# Patient Record
Sex: Male | Born: 1965 | Race: White | Hispanic: No | Marital: Single
Health system: Southern US, Community
[De-identification: ages and names within clinical notes are randomized; demographics above are authoritative.]

---

## 2017-05-10 ENCOUNTER — Emergency Department (HOSPITAL_COMMUNITY): Payer: Self-pay

## 2017-05-10 ENCOUNTER — Emergency Department (HOSPITAL_COMMUNITY)
Admission: EM | Admit: 2017-05-10 | Discharge: 2017-05-10 | Discharge status: Against Medical Advice | Payer: Self-pay | Attending: Emergency Medicine | Admitting: Emergency Medicine

## 2017-05-10 ENCOUNTER — Other Ambulatory Visit: Payer: Self-pay

## 2017-05-10 DIAGNOSIS — X58XXXA Exposure to other specified factors, initial encounter: Secondary | ICD-10-CM | POA: Insufficient documentation

## 2017-05-10 DIAGNOSIS — Y999 Unspecified external cause status: Secondary | ICD-10-CM | POA: Insufficient documentation

## 2017-05-10 DIAGNOSIS — Y939 Activity, unspecified: Secondary | ICD-10-CM | POA: Insufficient documentation

## 2017-05-10 DIAGNOSIS — S5012XA Contusion of left forearm, initial encounter: Secondary | ICD-10-CM | POA: Insufficient documentation

## 2017-05-10 DIAGNOSIS — F10920 Alcohol use, unspecified with intoxication, uncomplicated: Secondary | ICD-10-CM | POA: Insufficient documentation

## 2017-05-10 DIAGNOSIS — Y929 Unspecified place or not applicable: Secondary | ICD-10-CM | POA: Insufficient documentation

## 2017-05-10 MED ORDER — TETANUS-DIPHTH-ACELL PERTUSSIS 5-2.5-18.5 LF-MCG/0.5 IM SUSP: 0.5000 mL | Freq: Once | INTRAMUSCULAR | Status: DC

## 2017-05-10 NOTE — ED Provider Notes (Signed)
TIME SEEN: 3:25 AM  CHIEF COMPLAINT: Left forearm injury  HPI: Patient is a 52 year old male who presents to the emergency department with left forearm injury.  He is unable to tell me how he injured his arm.  He is intoxicated today.  Denies any other injury.  Unsure of his last tetanus vaccination.  Reports that he is ambidextrous.  ROS: See HPI Constitutional: no fever  Eyes: no drainage  ENT: no runny nose   Cardiovascular:  no chest pain  Resp: no SOB  GI: no vomiting GU: no dysuria Integumentary: no rash  Allergy: no hives  Musculoskeletal: no leg swelling  Neurological: no slurred speech ROS otherwise negative  PAST MEDICAL HISTORY/PAST SURGICAL HISTORY:  No past medical history on file.  MEDICATIONS:  Prior to Admission medications   Not on File    ALLERGIES:  Allergies no known allergies  SOCIAL HISTORY:  Social History   Tobacco Use  . Smoking status: Not on file  Substance Use Topics  . Alcohol use: Not on file    FAMILY HISTORY: No family history on file.  EXAM: BP (!) 157/108 (BP Location: Right Arm)   Pulse (!) 107   Resp 16   Ht 5\' 11"  (1.803 m)   Wt 72.6 kg (160 lb)   SpO2 98%   BMI 22.32 kg/m  CONSTITUTIONAL: Alert and oriented and responds appropriately to questions.  Chronically ill-appearing, intoxicated but able to walk and talk without difficulty HEAD: Normocephalic; atraumatic EYES: Conjunctivae clear, PERRL, EOMI ENT: normal nose; no rhinorrhea; moist mucous membranes; pharynx without lesions noted; no dental injury; no septal hematoma NECK: Supple, no meningismus, no LAD; no midline spinal tenderness, step-off or deformity; trachea midline CARD: RRR; S1 and S2 appreciated; no murmurs, no clicks, no rubs, no gallops RESP: Normal chest excursion without splinting or tachypnea; breath sounds clear and equal bilaterally; no wheezes, no rhonchi, no rales; no hypoxia or respiratory distress CHEST:  chest wall stable, no crepitus or  ecchymosis or deformity, nontender to palpation; no flail chest ABD/GI: Normal bowel sounds; non-distended; soft, non-tender, no rebound, no guarding; no ecchymosis or other lesions noted PELVIS:  stable, nontender to palpation BACK:  The back appears normal and is non-tender to palpation, there is no CVA tenderness; no midline spinal tenderness, step-off or deformity EXT: Contusion to the lateral aspect of the dorsal left forearm with associated abrasion.  2+ radial pulses bilaterally.  Normal ROM in all joints; non-tender to palpation; no edema; normal capillary refill; no cyanosis, no bony tenderness or bony deformity of patient's extremities, no joint effusion, compartments are soft, extremities are warm and well-perfused, no ecchymosis SKIN: Normal color for age and race; warm NEURO: Moves all extremities equally PSYCH: The patient's mood and manner are appropriate. Grooming and personal hygiene are appropriate.  MEDICAL DECISION MAKING: Patient here with left forearm injury.  X-ray shows no fracture.  Appears to be an abrasion and contusion.  Wound has been cleaned and dressed.  He refused tetanus vaccination.  No other sign of injury on exam.  He is intoxicated but is able to ambulate and does not have slurred speech and is not vomiting.  I feel he is safe to be discharged.  At this time, I do not feel there is any life-threatening condition present. I have reviewed and discussed all results (EKG, imaging, lab, urine as appropriate) and exam findings with patient/family. I have reviewed nursing notes and appropriate previous records.  I feel the patient is safe to be  discharged home without further emergent workup and can continue workup as an outpatient as needed. Discussed usual and customary return precautions. Patient/family verbalize understanding and are comfortable with this plan.  Outpatient follow-up has been provided if needed. All questions have been answered.      Lafaye Mcelmurry, Layla Maw,  DO 05/10/17 [phone]

## 2017-05-10 NOTE — ED Triage Notes (Signed)
[facility] 911 after hearing patient yelling "about my broke arm". [employer] with patient. Patient is loud and aggressive. Patient will not give story about what happened to forearm. Patient intoxicated. [facility]

## 2017-05-10 NOTE — Discharge Instructions (Addendum)
You may alternate Tylenol 1000 mg every 6 hours as needed for pain and Ibuprofen 800 mg every 8 hours as needed for pain.  Please take Ibuprofen with food. ° °

## 2017-05-10 NOTE — ED Triage Notes (Signed)
Pt is intoxicated and speaking loudly w/irradic behaviour.  Has obvious injury to LT arm.  States he has no idea when or how the injury happened.  He is not being very cooperative and not answering all questions appropriately.  [employer] here along w/security.

## 2017-05-10 NOTE — ED Notes (Signed)
Bed: WA06 Expected date:  Expected time:  Means of arrival:  Comments: EMS ETOH/arm fx

## 2017-05-10 NOTE — ED Notes (Signed)
Pt continued to demand isopropol alcohol and to speak to the radiologist about his xray result.  When the EDP came in to tell him the arm wasn't broken, pt walked out of the department w/[employer] and security following him.  He did not stay for his Tdap or his d/c papers or to have his arm cleaned and dressed.

## 2017-10-27 ENCOUNTER — Encounter: Payer: Self-pay | Admitting: Pediatric Intensive Care

## 2017-11-23 NOTE — Congregational Nurse Program (Signed)
BP check  

## 2019-08-22 IMAGING — CR DG FOREARM 2V*L*
2 series · 2 of 2 positions shown · non-contrast
Comparison: None.

CLINICAL DATA: Left forearm injury.

EXAM:
LEFT FOREARM - 2 VIEW

[x forearm ap left]
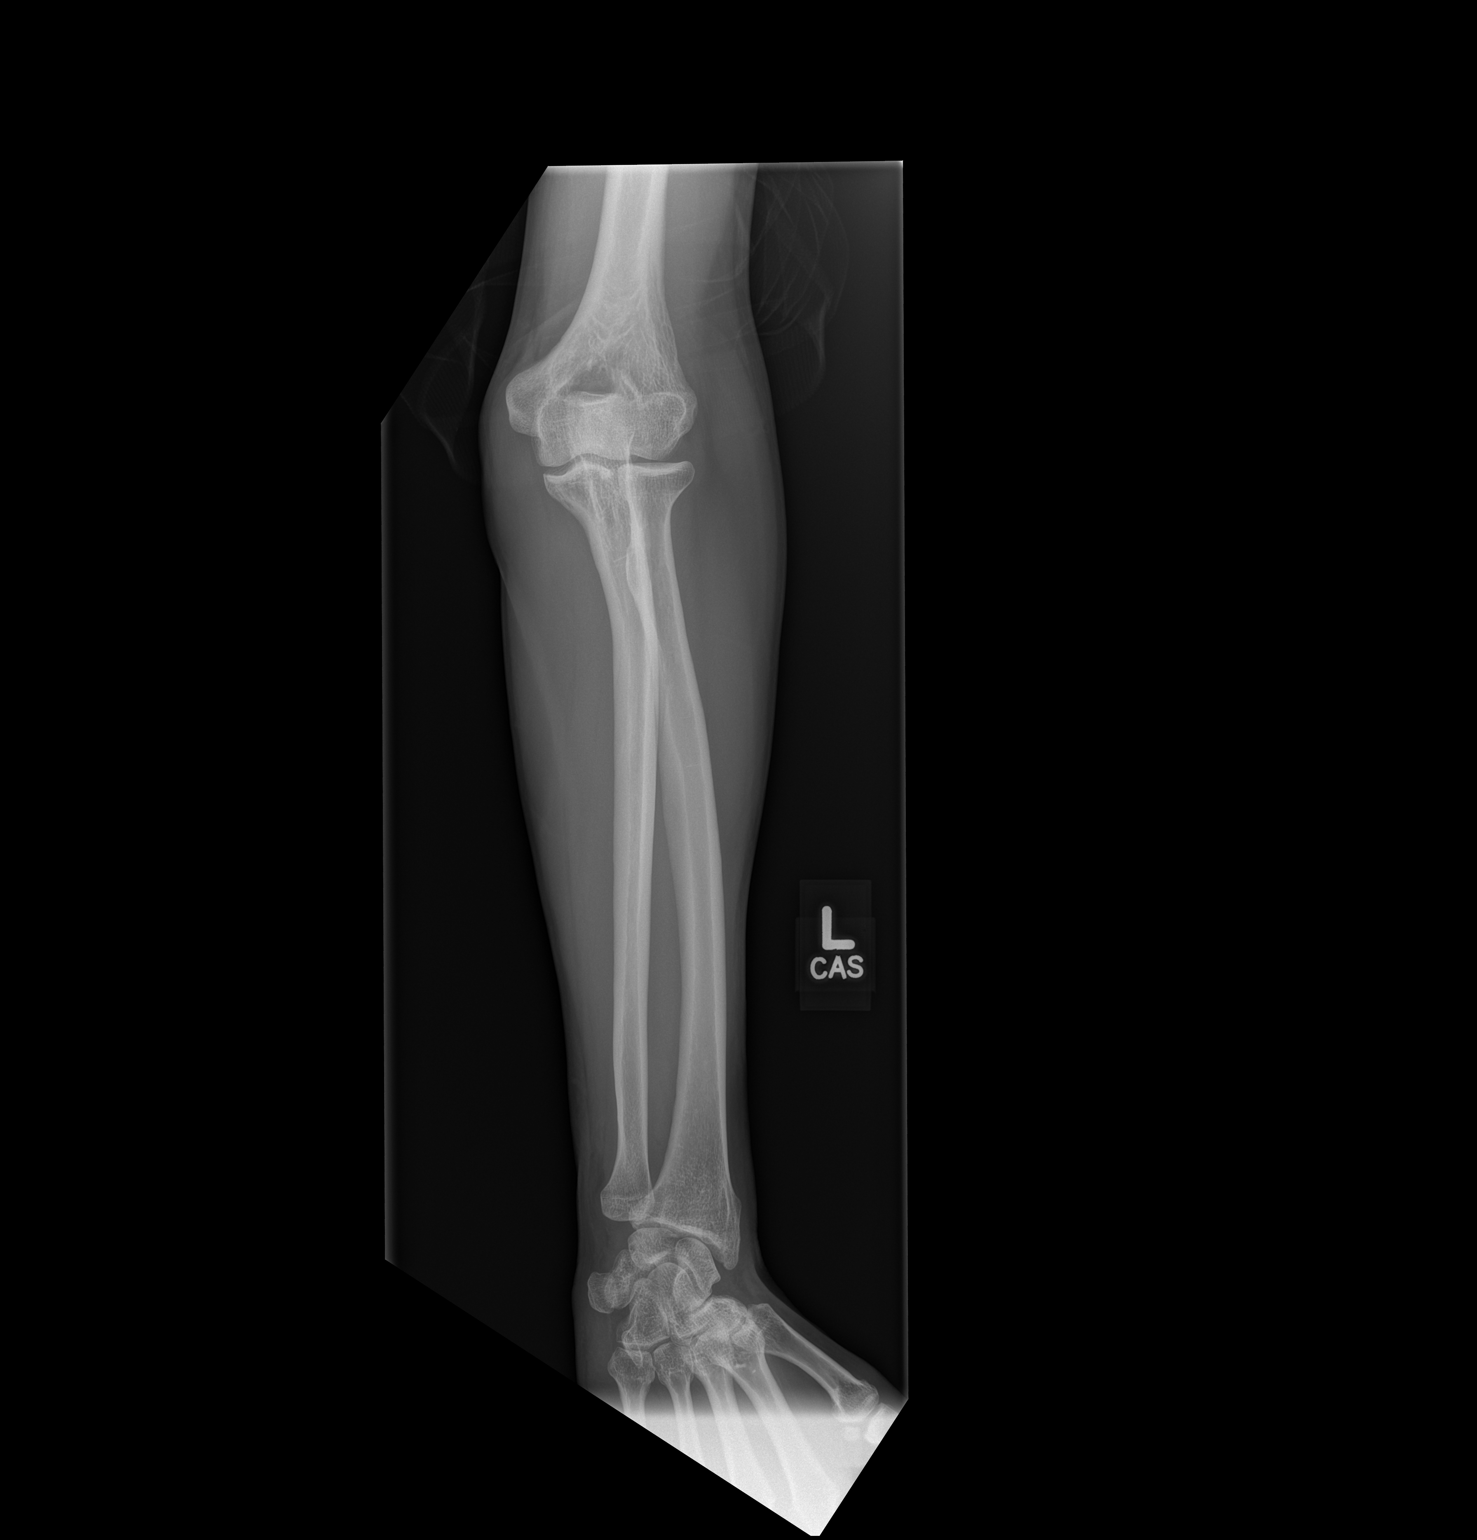

[x forearm lat left]
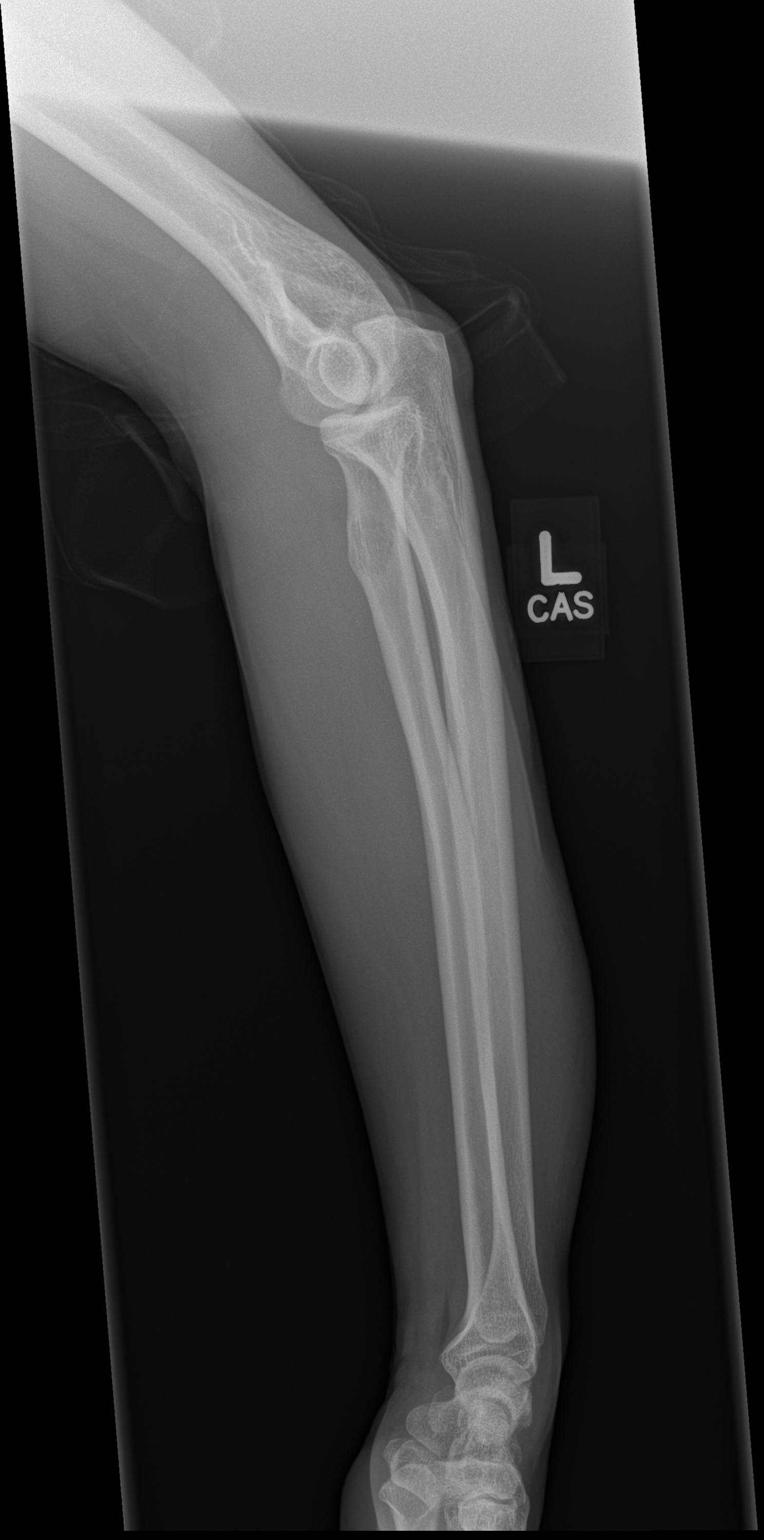

[2 of 2 positions shown; findings below may reference images not displayed]

FINDINGS: There is no evidence of fracture or other focal bone lesions. Soft
tissues are unremarkable.
IMPRESSION: No fracture or dislocation of the left forearm.
